# Patient Record
Sex: Female | Born: 1995
Health system: Southern US, Community
[De-identification: ages and names within clinical notes are randomized; demographics above are authoritative.]

## PROBLEM LIST (undated history)

## (undated) HISTORY — PX: EYE SURGERY: SHX253

---
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
## 2015-07-30 ENCOUNTER — Ambulatory Visit: Payer: Self-pay | Admitting: Nurse Practitioner

## 2015-08-02 ENCOUNTER — Ambulatory Visit: Payer: Self-pay | Admitting: Nurse Practitioner

## 2017-03-23 ENCOUNTER — Encounter: Payer: Self-pay | Admitting: Family Medicine

## 2017-03-23 ENCOUNTER — Ambulatory Visit: Payer: BLUE CROSS/BLUE SHIELD | Admitting: Family Medicine

## 2017-03-23 VITALS — BP 114/76 | Temp 98.6°F | Ht 62.0 in | Wt 234.8 lb

## 2017-03-23 DIAGNOSIS — J329 Chronic sinusitis, unspecified: Secondary | ICD-10-CM | POA: Diagnosis not present

## 2017-03-23 MED ORDER — CEFDINIR 300 MG PO CAPS: 300.0000 mg | ORAL_CAPSULE | Freq: Two times a day (BID) | ORAL | 0 refills | Status: DC

## 2017-03-23 NOTE — Progress Notes (Signed)
   Subjective:    Patient ID: Krista Larson, female    DOB: October 15, 1995, 22 y.o.   MRN: [id number]  Sinusitis  This is a new problem. Episode onset: one week. Associated symptoms include congestion, coughing, headaches and a sore throat. Treatments tried: Catering manageralka seltzer.   First stated with cong and dranage  Had con and dranage for over a week  Then throat became sore coughing gost deeper  Then  ppos prod cough  h a wors in the morn frontal  Pos nasal dih in the ornin g   Review of Systems  HENT: Positive for congestion and sore throat.   Respiratory: Positive for cough.   Neurological: Positive for headaches.       Objective:   Physical Exam  Alert, mild malaise. Hydration good Vitals stable. frontal/ maxillary tenderness evident positive nasal congestion. pharynx normal neck supple  lungs clear/no crackles or wheezes. heart regular in rhythm       Assessment & Plan:  Impression rhinosinusitis likely post viral, discussed with patient. plan antibiotics prescribed. Questions answered. Symptomatic care discussed. warning signs discussed. WSL

## 2018-01-09 ENCOUNTER — Ambulatory Visit (HOSPITAL_COMMUNITY)
Admission: EM | Admit: 2018-01-09 | Discharge: 2018-01-09 | Disposition: A | Discharge status: Home / Self Care | Payer: Self-pay | Attending: Family Medicine | Admitting: Family Medicine

## 2018-01-09 ENCOUNTER — Encounter (HOSPITAL_COMMUNITY): Payer: Self-pay

## 2018-01-09 DIAGNOSIS — B084 Enteroviral vesicular stomatitis with exanthem: Secondary | ICD-10-CM | POA: Insufficient documentation

## 2018-01-09 MED ORDER — CHLORHEXIDINE GLUCONATE 0.12 % MT SOLN: 15.0000 mL | Freq: Two times a day (BID) | OROMUCOSAL | 0 refills | Status: AC

## 2018-01-09 MED ORDER — TRIAMCINOLONE ACETONIDE 0.1 % EX CREA: 1.0000 "application " | TOPICAL_CREAM | Freq: Two times a day (BID) | CUTANEOUS | 0 refills | Status: AC

## 2018-01-09 MED ORDER — CETIRIZINE HCL 10 MG PO TABS: 10.0000 mg | ORAL_TABLET | Freq: Every day | ORAL | 1 refills | Status: AC

## 2018-01-09 NOTE — ED Triage Notes (Signed)
Pt presents with sore throat and rash on noth hands.

## 2018-01-09 NOTE — Discharge Instructions (Signed)
Return if rash persists.

## 2018-01-09 NOTE — ED Provider Notes (Addendum)
[facility]   CSN: [id number] Arrival date & time: 01/09/18  1414     History   Chief Complaint Chief Complaint  Patient presents with  . Sore Throat  . Rash    Hands    HPI Krista Larson is a 22 y.o. female.   This is an initial visit for this 22 year old woman to the [facility] urgent care center.  She complains of sore throat and a rash on both hands.  She was visiting her sister recently along with her nieces when they were diagnosed with hand-foot-and-mouth disease.  She started noticing a rash last night after having a fever on Monday night.  The rash involves the palms of her hands, the webspaces, and the proximal palm.  She also has a sore throat.  She has no rash on her feet.  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     History reviewed. No pertinent past medical history.  Patient Active Problem List   Diagnosis Date Noted  . DUB (dysfunctional uterine bleeding) 06/21/2012  . Acne vulgaris 06/21/2012    Past Surgical History:  Procedure Laterality Date  . EYE SURGERY      OB History   No obstetric history on file.      Home Medications    Prior to Admission medications   Medication Sig Start Date End Date Taking? Authorizing Provider  cetirizine (ZYRTEC) 10 MG tablet Take 1 tablet (10 mg total) by mouth daily. 01/09/18   Elvina Sidle, MD  chlorhexidine (PERIDEX) 0.12 % solution Use as directed 15 mLs in the mouth or throat 2 (two) times daily. 01/09/18   Elvina Sidle, MD  triamcinolone cream (KENALOG) 0.1 % Apply 1 application topically 2 (two) times daily. 01/09/18   Elvina Sidle, MD    Family History History reviewed. No pertinent family history.  Social History Social History   Tobacco Use  . Smoking status: Never Smoker  . Smokeless tobacco: Never Used  Substance Use Topics  . Alcohol use: No  . Drug use: No     Allergies   Patient has no known allergies.   Review of Systems Review of  Systems   Physical Exam Triage Vital Signs ED Triage Vitals  Enc Vitals Group     BP 01/09/18 1438 129/75     Pulse Rate 01/09/18 1438 (!) 107     Resp 01/09/18 1438 20     Temp 01/09/18 1438 98.2 F (36.8 C)     Temp Source 01/09/18 1438 Oral     SpO2 01/09/18 1438 98 %     Weight --      Height --      Head Circumference --      Peak Flow --      Pain Score 01/09/18 1441 4     Pain Loc --      Pain Edu? --      Excl. in GC? --    No data found.  Updated Vital Signs BP 129/75 (BP Location: Left Arm)   Pulse (!) 107   Temp 98.2 F (36.8 C) (Oral)   Resp 20   SpO2 98%    Physical Exam Vitals signs and nursing note reviewed.  Constitutional:      Appearance: She is well-developed.  HENT:     Head: Normocephalic.     Mouth/Throat:     Mouth: Mucous membranes are moist.     Pharynx: Posterior oropharyngeal erythema present.  Eyes:  Conjunctiva/sclera: Conjunctivae normal.     Pupils: Pupils are equal, round, and reactive to light.  Neck:     Musculoskeletal: Normal range of motion and neck supple.  Skin:    General: Skin is warm and dry.     Findings: Erythema present.     Comments: Acne on face  Papular rash on palms and distal forearms  Neurological:     General: No focal deficit present.     Mental Status: She is alert and oriented to person, place, and time.  Psychiatric:        Mood and Affect: Mood normal.        Behavior: Behavior normal.          UC Treatments / Results  Labs (all labs ordered are listed, but only abnormal results are displayed) Labs Reviewed - No data to display  EKG None  Radiology No results found.  Procedures Procedures (including critical care time)  Medications Ordered in UC Medications - No data to display  Initial Impression / Assessment and Plan / UC Course  I have reviewed the triage vital signs and the nursing notes.  Pertinent labs & imaging results that were available during my care of the  patient were reviewed by me and considered in my medical decision making (see chart for details).    Final Clinical Impressions(s) / UC Diagnoses   Final diagnoses:  Hand, foot and mouth disease     Discharge Instructions     Return if rash persists.    ED Prescriptions    Medication Sig Dispense Auth. Provider   chlorhexidine (PERIDEX) 0.12 % solution Use as directed 15 mLs in the mouth or throat 2 (two) times daily. 120 mL Elvina SidleLauenstein, Noa Galvao, MD   cetirizine (ZYRTEC) 10 MG tablet Take 1 tablet (10 mg total) by mouth daily. 10 tablet Elvina SidleLauenstein, Hertha Gergen, MD   triamcinolone cream (KENALOG) 0.1 % Apply 1 application topically 2 (two) times daily. 30 g Elvina SidleLauenstein, Taysha Majewski, MD     Controlled Substance Prescriptions [place] Controlled Substance Registry consulted? Not Applicable   Elvina SidleLauenstein, Pinky Ravan, MD 01/09/18 1501    Elvina SidleLauenstein, Margerie Fraiser, MD 01/09/18 [phone]

## 2018-01-11 ENCOUNTER — Encounter: Payer: Self-pay | Admitting: Family Medicine

## 2018-01-11 ENCOUNTER — Ambulatory Visit (INDEPENDENT_AMBULATORY_CARE_PROVIDER_SITE_OTHER): Payer: BLUE CROSS/BLUE SHIELD | Admitting: Family Medicine

## 2018-01-11 VITALS — BP 102/70 | Temp 98.2°F | Ht 62.0 in | Wt 245.0 lb

## 2018-01-11 DIAGNOSIS — B084 Enteroviral vesicular stomatitis with exanthem: Secondary | ICD-10-CM | POA: Diagnosis not present

## 2018-01-11 NOTE — Progress Notes (Signed)
   Subjective:    Patient ID: Krista Larson, female    DOB: [birth year], 22 y.o.   MRN: [id number]  Rash  This is a new problem. Episode onset: 5 days. Location: hands, feet. The rash is characterized by itchiness, burning, pain and redness. Associated with: sister, neices and nephews have been diagnosed with hand, foot, mouth,  Treatments tried: zyrtec, triamcinolone. The treatment provided no relief.   Pt went to urgent care on the 18th and was diagnosed with scabies.    Started Monday  Sister had the ndn for and mouth disease   Pt noted mild fever   mon and tu had fever   Felt irritation of the hand   Went to the urgi care   Wondered if she had scabies   Then itching and burning   Some feet involvement    Review of Systems  Skin: Positive for rash.       Objective:   Physical Exam  Alert vitals stable, NAD. Mild malaiseBlood pressure good on repeat. HEENT pharynx blisters normal. Lungs clear. Heart regular rate and rhythm. Skin habds abnd feet blisters present      Assessment & Plan:  H F and M disease, discussed, management and ws 's disc, no abx rationale disc,   Greater than 50% of this 15 minute face to face visit was spent in counseling and discussion and coordination of care regarding the above diagnosis/diagnosies

## 2019-02-19 ENCOUNTER — Encounter: Payer: Self-pay | Admitting: Family Medicine

## 2019-02-20 ENCOUNTER — Encounter: Payer: Self-pay | Admitting: Family Medicine
